# Patient Record
Sex: Female | Born: 1985 | Race: White | Hispanic: No | Marital: Single
Health system: Southern US, Community
[De-identification: ages and names within clinical notes are randomized; demographics above are authoritative.]

## PROBLEM LIST (undated history)

## (undated) ENCOUNTER — Inpatient Hospital Stay: Admission: EM | Payer: Self-pay | Source: Home / Self Care

---

## 2013-03-22 ENCOUNTER — Ambulatory Visit (HOSPITAL_COMMUNITY)
Admission: RE | Admit: 2013-03-22 | Discharge: 2013-03-22 | Disposition: A | Discharge status: Home / Self Care | Payer: Medicare Other | Source: Ambulatory Visit | Attending: Internal Medicine | Admitting: Internal Medicine

## 2013-03-22 DIAGNOSIS — R55 Syncope and collapse: Secondary | ICD-10-CM

## 2013-03-22 DIAGNOSIS — R569 Unspecified convulsions: Secondary | ICD-10-CM

## 2013-03-23 DIAGNOSIS — R569 Unspecified convulsions: Secondary | ICD-10-CM | POA: Insufficient documentation

## 2013-03-23 DIAGNOSIS — R55 Syncope and collapse: Secondary | ICD-10-CM | POA: Insufficient documentation

## 2013-03-23 NOTE — Procedures (Addendum)
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  Background: There is a well defined posterior dominant rhythm of 9 Hz that attenuates with eye opening. There is an increase in delta associated with drowsiness and normal sleep structures including vertex waves and k-complexes are seen. There is hypersynchrony during hyperventilation. Positive occipital sharp transients of sleep were seen.   Photic stimulation: Physiologic driving is not performed.   EEG Abnormalities: None  Clinical Interpretation: This normal EEG is recorded in the waking and sleep state. There was no seizure or seizure predisposition recorded on this study. Note that a normal EEG does not exclude the possibility of epilepsy.   Ritta Slot, MD [facility] [phone]  If 7pm- 7am, please page neurology on call at [phone].

## 2013-05-30 ENCOUNTER — Other Ambulatory Visit: Payer: Self-pay

## 2013-06-22 ENCOUNTER — Other Ambulatory Visit (HOSPITAL_COMMUNITY): Payer: Self-pay | Admitting: Obstetrics and Gynecology

## 2013-06-22 DIAGNOSIS — Z3682 Encounter for antenatal screening for nuchal translucency: Secondary | ICD-10-CM

## 2013-06-29 ENCOUNTER — Ambulatory Visit (HOSPITAL_COMMUNITY)
Admission: RE | Admit: 2013-06-29 | Discharge: 2013-06-29 | Disposition: A | Discharge status: Home / Self Care | Payer: Medicare Other | Source: Ambulatory Visit | Attending: Obstetrics and Gynecology | Admitting: Obstetrics and Gynecology

## 2013-06-29 ENCOUNTER — Encounter (HOSPITAL_COMMUNITY): Payer: Self-pay

## 2013-06-29 ENCOUNTER — Other Ambulatory Visit: Payer: Self-pay

## 2013-06-29 VITALS — BP 124/81 | HR 98 | Wt 165.0 lb

## 2013-06-29 DIAGNOSIS — O99340 Other mental disorders complicating pregnancy, unspecified trimester: Secondary | ICD-10-CM | POA: Insufficient documentation

## 2013-06-29 DIAGNOSIS — O99350 Diseases of the nervous system complicating pregnancy, unspecified trimester: Secondary | ICD-10-CM

## 2013-06-29 DIAGNOSIS — O3510X0 Maternal care for (suspected) chromosomal abnormality in fetus, unspecified, not applicable or unspecified: Secondary | ICD-10-CM | POA: Insufficient documentation

## 2013-06-29 DIAGNOSIS — Z3682 Encounter for antenatal screening for nuchal translucency: Secondary | ICD-10-CM

## 2013-06-29 DIAGNOSIS — Z8673 Personal history of transient ischemic attack (TIA), and cerebral infarction without residual deficits: Secondary | ICD-10-CM | POA: Insufficient documentation

## 2013-06-29 DIAGNOSIS — O351XX0 Maternal care for (suspected) chromosomal abnormality in fetus, not applicable or unspecified: Secondary | ICD-10-CM | POA: Insufficient documentation

## 2013-06-29 DIAGNOSIS — F1910 Other psychoactive substance abuse, uncomplicated: Secondary | ICD-10-CM | POA: Insufficient documentation

## 2013-06-29 DIAGNOSIS — Z3689 Encounter for other specified antenatal screening: Secondary | ICD-10-CM | POA: Insufficient documentation

## 2013-06-29 DIAGNOSIS — G40909 Epilepsy, unspecified, not intractable, without status epilepticus: Secondary | ICD-10-CM

## 2013-06-29 DIAGNOSIS — O09899 Supervision of other high risk pregnancies, unspecified trimester: Secondary | ICD-10-CM | POA: Insufficient documentation

## 2013-06-29 DIAGNOSIS — O352XX0 Maternal care for (suspected) hereditary disease in fetus, not applicable or unspecified: Secondary | ICD-10-CM | POA: Insufficient documentation

## 2013-06-29 NOTE — Consult Note (Signed)
Maternal Fetal Medicine Consultation  Requesting Provider(s): Hassell Doneraig Gaccione, MD  Reason for consultation: Cheryl Molina is a 28 yo G2P0010, EDD 01/02/2014 who is currently at 13 2/7 weeks referred due to seizure disorder.  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  In 2007, she developed a seizure disorder that is felt to be secondary to hydrocephalus and history of an encephalocele.   She reports that her baseline is 5-10 seizures / month despite multiple AEDs that she is currently taking.  The patient reports that [place] and recently established care with Dr. Adella HareApplegate ([place].  She is without complaints today.  Cheryl Molina reports a history of a prior 20 week loss - she reports that she was at home and the baby came out - a heart beat was appreciated after delivery.  There was some question that this might have been related to seizure activity that was poorly controlled at that time.  There was never any mention of possible cervical insufficiency.   HPI: OB History: OB History   Grav Para Term Preterm Abortions TAB SAB Ect Mult Living   2    1  1          PMH: As above  PSH: Cholecystectomy, VP shunt placement and redo  Meds:  Kepra 3000 mg daily Lamictal 100 mg BID Gabapentin 100 mg TID Dilantin 100 mg BID Topamax 50 mg BID PNV Zofran  Allergies: Vancomycin - "Redman syndrome"  FH: Father - diabetes, stroke, MI        Paternal grandfather - diabetes        Paternal grandmother - MI        Maternal grandmother - Parkinson's Disease        With the exception of a personal history of encephalocele, denies any other family history of birth defects or        hereditary disorders        Soc:  Denies tobacco or ETOH use   Review of Systems: no vaginal  bleeding or cramping/contractions, no LOF, no nausea/vomiting. All other systems reviewed and are negative.  PE:   Filed Vitals:   06/29/13 1313  BP: 124/81  Pulse: 98    GEN: well-appearing female ABD: gravid, NT  Ultrasound - see separate report.  First trimester screen performed as described in report.  A/P: 1) Single IUP at 13 2/7 weeks         2) Seizure disorder on multiple AEDs - Cheryl Molina continues to have frequent seizures despite multiple medications.  She will continue to need close follow up with her neurologist.  Women with seizure disorders are at increased risk of congenital abnormality regardless of treatment and would therefore recommend a detailed ultrasound at 18-20 weeks and fetal echo at 22 weeks if the fetal heart is not well visualized.  The maternal risk of seizures, however, far outweighs any potential risk for congenital anomalies due to AEDs. Close monitoring of drug levels is advised as the increased volume of distribution, hepatic metabolism and glomerular filtration rate increase during pregnancy and will often lead to inadequate plasma drug levels.  We have also requested records from her Neurologist in [place] for further information.  3) History of 20 week loss - the patient reports that this was likely related to her seizures being poorly controlled, but by history, would be concerned about the possibility of cervical insufficiency.  Recommendations: 1) Continue AED medications as written.  Doses may need to be adjusted to achieve therapeutic plasma levels (under the guidance of the patient's neurologist) 2) Recommend cervical length surveillance from 16-24 weeks. The patient is tentatively scheduled to follow up here at 16 weeks and will need reevaluation at least every other week until 24 weeks. 3) Detailed ultrasound at 18 weeks +/- fetal echo at 22 weeks 4) Serial growth ultrasounds (monthly) beginning at 28 weeks   Thank you for the opportunity to  be a part of the care of KELISHA DALL. Please contact our office if we can be of further assistance.   I spent approximately 30 minutes with this patient with over 50% of time spent in face-to-face counseling.  Alpha Gula, MD Maternal Fetal Medicine

## 2013-06-29 NOTE — Addendum Note (Signed)
Encounter addended by: Alessandra BevelsJennifer M. Chase PicketLineberry, RN on: 06/29/2013  1:12 PM<BR>     Documentation filed: Charges VN

## 2013-07-20 ENCOUNTER — Ambulatory Visit (HOSPITAL_COMMUNITY)
Admission: RE | Admit: 2013-07-20 | Discharge: 2013-07-20 | Disposition: A | Discharge status: Home / Self Care | Payer: Medicare Other | Source: Ambulatory Visit | Attending: Neurology | Admitting: Neurology

## 2013-07-20 ENCOUNTER — Encounter (HOSPITAL_COMMUNITY): Payer: Self-pay

## 2013-07-20 DIAGNOSIS — O99350 Diseases of the nervous system complicating pregnancy, unspecified trimester: Secondary | ICD-10-CM

## 2013-07-20 DIAGNOSIS — O352XX0 Maternal care for (suspected) hereditary disease in fetus, not applicable or unspecified: Secondary | ICD-10-CM | POA: Insufficient documentation

## 2013-07-20 DIAGNOSIS — F1910 Other psychoactive substance abuse, uncomplicated: Secondary | ICD-10-CM | POA: Insufficient documentation

## 2013-07-20 DIAGNOSIS — G40909 Epilepsy, unspecified, not intractable, without status epilepticus: Secondary | ICD-10-CM | POA: Insufficient documentation

## 2013-07-20 DIAGNOSIS — O09899 Supervision of other high risk pregnancies, unspecified trimester: Secondary | ICD-10-CM | POA: Insufficient documentation

## 2013-07-20 DIAGNOSIS — O09299 Supervision of pregnancy with other poor reproductive or obstetric history, unspecified trimester: Secondary | ICD-10-CM | POA: Insufficient documentation

## 2013-07-20 DIAGNOSIS — O99340 Other mental disorders complicating pregnancy, unspecified trimester: Secondary | ICD-10-CM | POA: Insufficient documentation

## 2013-07-29 ENCOUNTER — Other Ambulatory Visit (HOSPITAL_COMMUNITY): Payer: Self-pay | Admitting: Maternal and Fetal Medicine

## 2013-07-29 DIAGNOSIS — O99350 Diseases of the nervous system complicating pregnancy, unspecified trimester: Principal | ICD-10-CM

## 2013-07-29 DIAGNOSIS — G40909 Epilepsy, unspecified, not intractable, without status epilepticus: Secondary | ICD-10-CM

## 2013-08-03 ENCOUNTER — Ambulatory Visit (HOSPITAL_COMMUNITY)
Admission: RE | Admit: 2013-08-03 | Discharge: 2013-08-03 | Disposition: A | Discharge status: Home / Self Care | Payer: Medicare Other | Source: Ambulatory Visit | Attending: Obstetrics and Gynecology | Admitting: Obstetrics and Gynecology

## 2013-08-03 ENCOUNTER — Other Ambulatory Visit (HOSPITAL_COMMUNITY): Payer: Self-pay | Admitting: Maternal and Fetal Medicine

## 2013-08-03 DIAGNOSIS — O09899 Supervision of other high risk pregnancies, unspecified trimester: Secondary | ICD-10-CM | POA: Insufficient documentation

## 2013-08-03 DIAGNOSIS — O4410 Placenta previa with hemorrhage, unspecified trimester: Secondary | ICD-10-CM | POA: Insufficient documentation

## 2013-08-03 DIAGNOSIS — O99350 Diseases of the nervous system complicating pregnancy, unspecified trimester: Principal | ICD-10-CM

## 2013-08-03 DIAGNOSIS — O352XX0 Maternal care for (suspected) hereditary disease in fetus, not applicable or unspecified: Secondary | ICD-10-CM | POA: Insufficient documentation

## 2013-08-03 DIAGNOSIS — F1910 Other psychoactive substance abuse, uncomplicated: Secondary | ICD-10-CM | POA: Insufficient documentation

## 2013-08-03 DIAGNOSIS — O99340 Other mental disorders complicating pregnancy, unspecified trimester: Secondary | ICD-10-CM | POA: Insufficient documentation

## 2013-08-03 DIAGNOSIS — Z3689 Encounter for other specified antenatal screening: Secondary | ICD-10-CM

## 2013-08-03 DIAGNOSIS — Z363 Encounter for antenatal screening for malformations: Secondary | ICD-10-CM | POA: Insufficient documentation

## 2013-08-03 DIAGNOSIS — Z1389 Encounter for screening for other disorder: Secondary | ICD-10-CM | POA: Insufficient documentation

## 2013-08-03 DIAGNOSIS — O4412 Placenta previa with hemorrhage, second trimester: Secondary | ICD-10-CM

## 2013-08-03 DIAGNOSIS — Z8673 Personal history of transient ischemic attack (TIA), and cerebral infarction without residual deficits: Secondary | ICD-10-CM | POA: Insufficient documentation

## 2013-08-03 DIAGNOSIS — G40909 Epilepsy, unspecified, not intractable, without status epilepticus: Secondary | ICD-10-CM | POA: Insufficient documentation

## 2013-08-03 DIAGNOSIS — O09299 Supervision of pregnancy with other poor reproductive or obstetric history, unspecified trimester: Secondary | ICD-10-CM | POA: Insufficient documentation

## 2013-08-03 DIAGNOSIS — O358XX0 Maternal care for other (suspected) fetal abnormality and damage, not applicable or unspecified: Secondary | ICD-10-CM | POA: Insufficient documentation

## 2013-08-17 ENCOUNTER — Ambulatory Visit (HOSPITAL_COMMUNITY): Payer: Medicare Other

## 2013-08-25 ENCOUNTER — Ambulatory Visit (HOSPITAL_COMMUNITY)
Admission: RE | Admit: 2013-08-25 | Discharge: 2013-08-25 | Disposition: A | Discharge status: Home / Self Care | Payer: Medicare Other | Source: Ambulatory Visit | Attending: Obstetrics and Gynecology | Admitting: Obstetrics and Gynecology

## 2013-08-25 ENCOUNTER — Other Ambulatory Visit (HOSPITAL_COMMUNITY): Payer: Self-pay | Admitting: Maternal and Fetal Medicine

## 2013-08-25 DIAGNOSIS — G40909 Epilepsy, unspecified, not intractable, without status epilepticus: Secondary | ICD-10-CM

## 2013-08-25 DIAGNOSIS — Z3689 Encounter for other specified antenatal screening: Secondary | ICD-10-CM | POA: Insufficient documentation

## 2013-08-25 DIAGNOSIS — O99350 Diseases of the nervous system complicating pregnancy, unspecified trimester: Principal | ICD-10-CM

## 2013-08-31 ENCOUNTER — Ambulatory Visit (HOSPITAL_COMMUNITY): Payer: Medicare Other

## 2013-09-08 ENCOUNTER — Ambulatory Visit (HOSPITAL_COMMUNITY)
Admission: RE | Admit: 2013-09-08 | Discharge: 2013-09-08 | Disposition: A | Discharge status: Home / Self Care | Payer: Medicare Other | Source: Ambulatory Visit | Attending: Obstetrics and Gynecology | Admitting: Obstetrics and Gynecology

## 2013-09-08 DIAGNOSIS — O99350 Diseases of the nervous system complicating pregnancy, unspecified trimester: Principal | ICD-10-CM

## 2013-09-08 DIAGNOSIS — Z3689 Encounter for other specified antenatal screening: Secondary | ICD-10-CM | POA: Insufficient documentation

## 2013-09-08 DIAGNOSIS — G40909 Epilepsy, unspecified, not intractable, without status epilepticus: Secondary | ICD-10-CM | POA: Insufficient documentation

## 2013-09-14 ENCOUNTER — Ambulatory Visit (HOSPITAL_COMMUNITY): Payer: Medicare Other

## 2013-09-22 ENCOUNTER — Ambulatory Visit (HOSPITAL_COMMUNITY)
Admission: RE | Admit: 2013-09-22 | Discharge: 2013-09-22 | Disposition: A | Discharge status: Home / Self Care | Payer: Medicare Other | Source: Ambulatory Visit | Attending: Obstetrics and Gynecology | Admitting: Obstetrics and Gynecology

## 2013-09-22 ENCOUNTER — Encounter (HOSPITAL_COMMUNITY): Payer: Self-pay

## 2013-09-22 DIAGNOSIS — G40909 Epilepsy, unspecified, not intractable, without status epilepticus: Secondary | ICD-10-CM | POA: Insufficient documentation

## 2013-09-22 DIAGNOSIS — O99350 Diseases of the nervous system complicating pregnancy, unspecified trimester: Principal | ICD-10-CM

## 2013-09-22 DIAGNOSIS — Z3689 Encounter for other specified antenatal screening: Secondary | ICD-10-CM | POA: Insufficient documentation

## 2013-09-23 ENCOUNTER — Other Ambulatory Visit (HOSPITAL_COMMUNITY): Payer: Self-pay | Admitting: Obstetrics and Gynecology

## 2013-09-23 DIAGNOSIS — O4410 Placenta previa with hemorrhage, unspecified trimester: Secondary | ICD-10-CM

## 2013-09-23 DIAGNOSIS — IMO0002 Reserved for concepts with insufficient information to code with codable children: Secondary | ICD-10-CM

## 2013-09-23 DIAGNOSIS — O9989 Other specified diseases and conditions complicating pregnancy, childbirth and the puerperium: Secondary | ICD-10-CM

## 2013-09-23 DIAGNOSIS — O352XX0 Maternal care for (suspected) hereditary disease in fetus, not applicable or unspecified: Secondary | ICD-10-CM

## 2013-10-05 ENCOUNTER — Other Ambulatory Visit (HOSPITAL_COMMUNITY): Payer: Self-pay | Admitting: Obstetrics and Gynecology

## 2013-10-05 DIAGNOSIS — O352XX0 Maternal care for (suspected) hereditary disease in fetus, not applicable or unspecified: Secondary | ICD-10-CM

## 2013-10-05 DIAGNOSIS — O4410 Placenta previa with hemorrhage, unspecified trimester: Secondary | ICD-10-CM

## 2013-10-05 DIAGNOSIS — O9989 Other specified diseases and conditions complicating pregnancy, childbirth and the puerperium: Secondary | ICD-10-CM

## 2013-10-05 DIAGNOSIS — Z8673 Personal history of transient ischemic attack (TIA), and cerebral infarction without residual deficits: Secondary | ICD-10-CM

## 2013-10-05 DIAGNOSIS — IMO0002 Reserved for concepts with insufficient information to code with codable children: Secondary | ICD-10-CM

## 2013-10-05 DIAGNOSIS — O09299 Supervision of pregnancy with other poor reproductive or obstetric history, unspecified trimester: Secondary | ICD-10-CM

## 2013-10-07 ENCOUNTER — Ambulatory Visit (HOSPITAL_COMMUNITY): Payer: Medicare Other

## 2013-10-12 ENCOUNTER — Other Ambulatory Visit (HOSPITAL_COMMUNITY): Payer: Self-pay | Admitting: Obstetrics and Gynecology

## 2013-10-12 ENCOUNTER — Ambulatory Visit (HOSPITAL_COMMUNITY)
Admission: RE | Admit: 2013-10-12 | Discharge: 2013-10-12 | Disposition: A | Discharge status: Home / Self Care | Payer: Medicare Other | Source: Ambulatory Visit | Attending: Obstetrics and Gynecology | Admitting: Obstetrics and Gynecology

## 2013-10-12 DIAGNOSIS — Z8673 Personal history of transient ischemic attack (TIA), and cerebral infarction without residual deficits: Secondary | ICD-10-CM | POA: Diagnosis not present

## 2013-10-12 DIAGNOSIS — O352XX0 Maternal care for (suspected) hereditary disease in fetus, not applicable or unspecified: Secondary | ICD-10-CM | POA: Diagnosis not present

## 2013-10-12 DIAGNOSIS — O9989 Other specified diseases and conditions complicating pregnancy, childbirth and the puerperium: Secondary | ICD-10-CM

## 2013-10-12 DIAGNOSIS — Z3689 Encounter for other specified antenatal screening: Secondary | ICD-10-CM | POA: Diagnosis not present

## 2013-10-12 DIAGNOSIS — IMO0002 Reserved for concepts with insufficient information to code with codable children: Secondary | ICD-10-CM

## 2013-10-12 DIAGNOSIS — O4410 Placenta previa with hemorrhage, unspecified trimester: Secondary | ICD-10-CM | POA: Diagnosis present

## 2013-10-12 DIAGNOSIS — O09299 Supervision of pregnancy with other poor reproductive or obstetric history, unspecified trimester: Secondary | ICD-10-CM

## 2013-10-12 DIAGNOSIS — O99340 Other mental disorders complicating pregnancy, unspecified trimester: Secondary | ICD-10-CM | POA: Insufficient documentation

## 2013-10-19 ENCOUNTER — Ambulatory Visit (HOSPITAL_COMMUNITY): Payer: Medicare Other

## 2013-10-26 ENCOUNTER — Ambulatory Visit (HOSPITAL_COMMUNITY)
Admission: RE | Admit: 2013-10-26 | Discharge: 2013-10-26 | Disposition: A | Discharge status: Home / Self Care | Payer: Medicare Other | Source: Ambulatory Visit | Attending: Obstetrics and Gynecology | Admitting: Obstetrics and Gynecology

## 2013-10-26 ENCOUNTER — Other Ambulatory Visit (HOSPITAL_COMMUNITY): Payer: Self-pay | Admitting: Obstetrics and Gynecology

## 2013-10-26 DIAGNOSIS — O9989 Other specified diseases and conditions complicating pregnancy, childbirth and the puerperium: Secondary | ICD-10-CM

## 2013-10-26 DIAGNOSIS — O352XX0 Maternal care for (suspected) hereditary disease in fetus, not applicable or unspecified: Secondary | ICD-10-CM | POA: Diagnosis present

## 2013-10-26 DIAGNOSIS — O4410 Placenta previa with hemorrhage, unspecified trimester: Secondary | ICD-10-CM | POA: Diagnosis not present

## 2013-10-26 DIAGNOSIS — O99340 Other mental disorders complicating pregnancy, unspecified trimester: Secondary | ICD-10-CM | POA: Insufficient documentation

## 2013-10-26 DIAGNOSIS — IMO0002 Reserved for concepts with insufficient information to code with codable children: Secondary | ICD-10-CM

## 2013-10-28 ENCOUNTER — Other Ambulatory Visit (HOSPITAL_COMMUNITY): Payer: Self-pay | Admitting: Obstetrics and Gynecology

## 2013-10-28 DIAGNOSIS — O352XX1 Maternal care for (suspected) hereditary disease in fetus, fetus 1: Secondary | ICD-10-CM

## 2013-10-28 DIAGNOSIS — Z8673 Personal history of transient ischemic attack (TIA), and cerebral infarction without residual deficits: Secondary | ICD-10-CM

## 2013-10-28 DIAGNOSIS — O09299 Supervision of pregnancy with other poor reproductive or obstetric history, unspecified trimester: Secondary | ICD-10-CM

## 2013-10-28 DIAGNOSIS — O365921 Maternal care for other known or suspected poor fetal growth, second trimester, fetus 1: Secondary | ICD-10-CM

## 2013-10-28 DIAGNOSIS — O9989 Other specified diseases and conditions complicating pregnancy, childbirth and the puerperium: Secondary | ICD-10-CM

## 2013-10-28 DIAGNOSIS — IMO0002 Reserved for concepts with insufficient information to code with codable children: Secondary | ICD-10-CM

## 2013-11-02 ENCOUNTER — Ambulatory Visit (HOSPITAL_COMMUNITY): Payer: Medicare Other | Attending: Obstetrics and Gynecology

## 2014-02-21 ENCOUNTER — Encounter (HOSPITAL_COMMUNITY): Payer: Self-pay

## 2014-05-04 ENCOUNTER — Encounter (HOSPITAL_COMMUNITY): Payer: Self-pay | Admitting: *Deleted

## 2016-01-02 IMAGING — US US MFM FETAL NUCHAL TRANSLUCENCY
1 series · 13 of 28 positions shown · non-contrast
Comparison: none

[Series 1: us mfm fetal nuchal translucency · 0.18mm/px · 36 acquisitions, 13 frames shown]
[im 2/36]
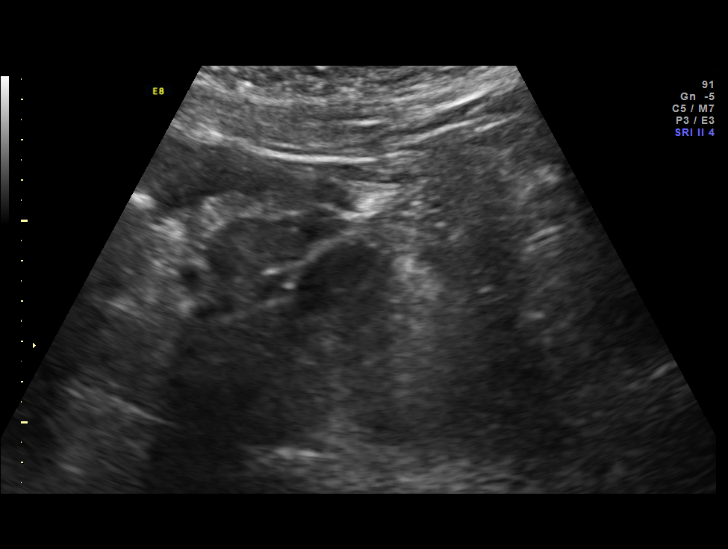
[im 4/36]
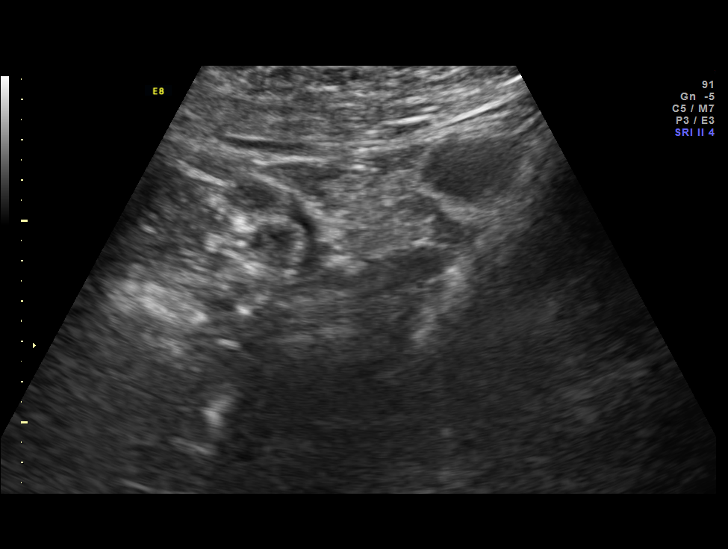
[im 7/36]
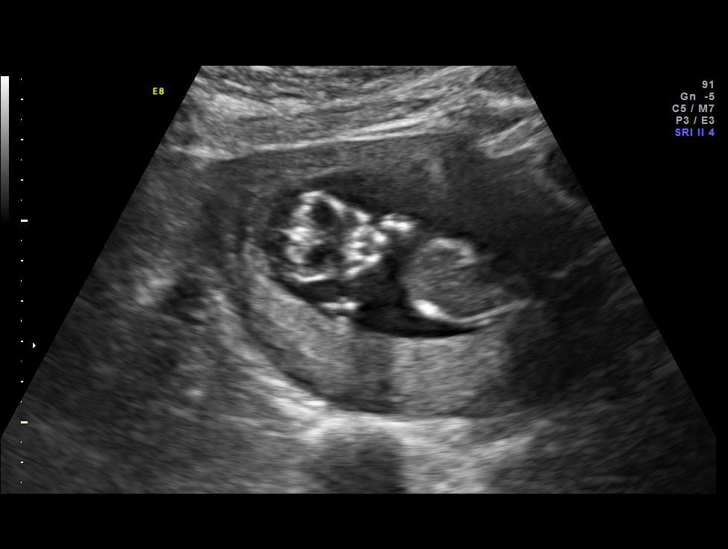
[im 10/36]
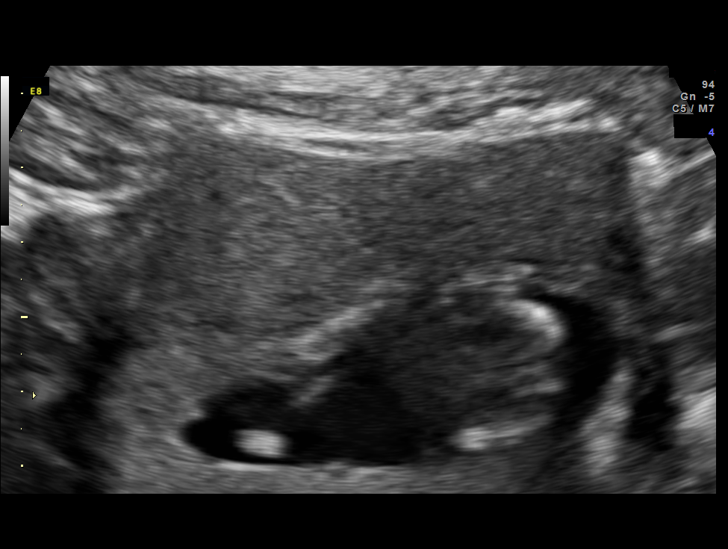
[im 12/36]
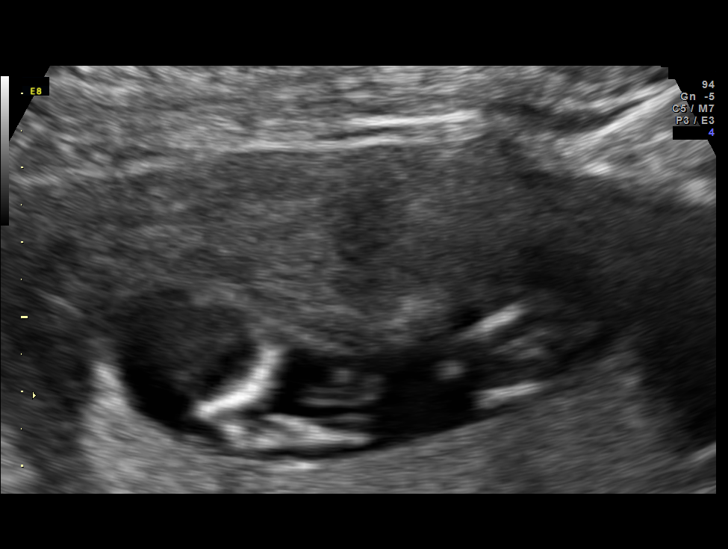
[im 15/36]
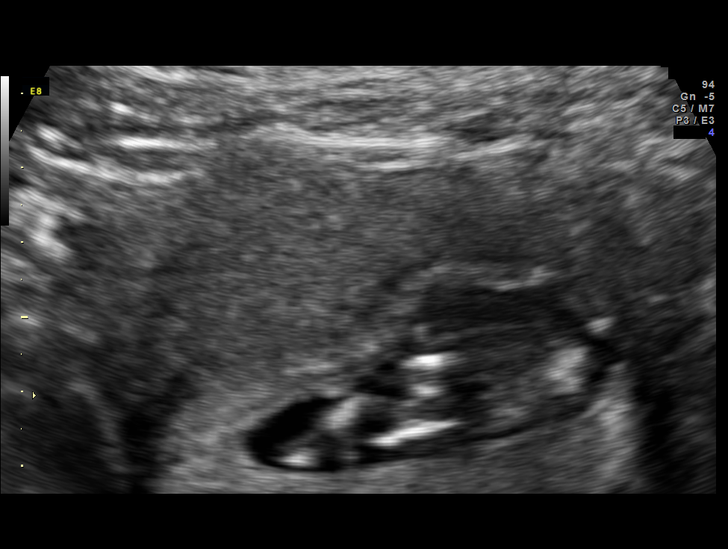
[im 19/36]
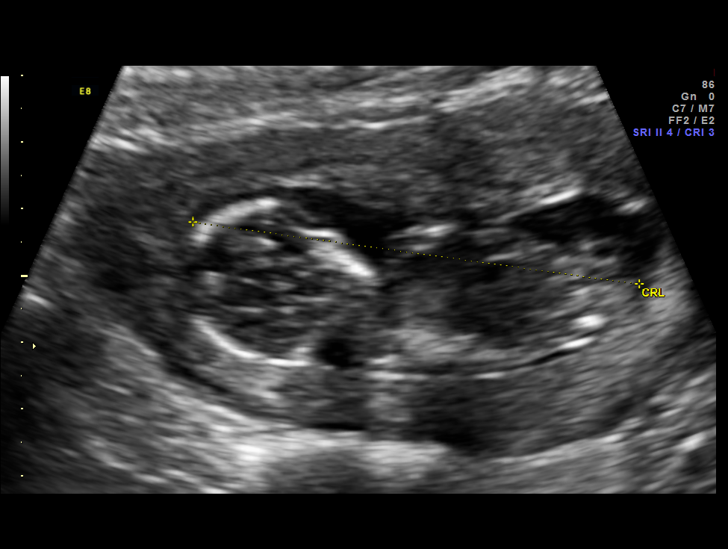
[im 21/36]
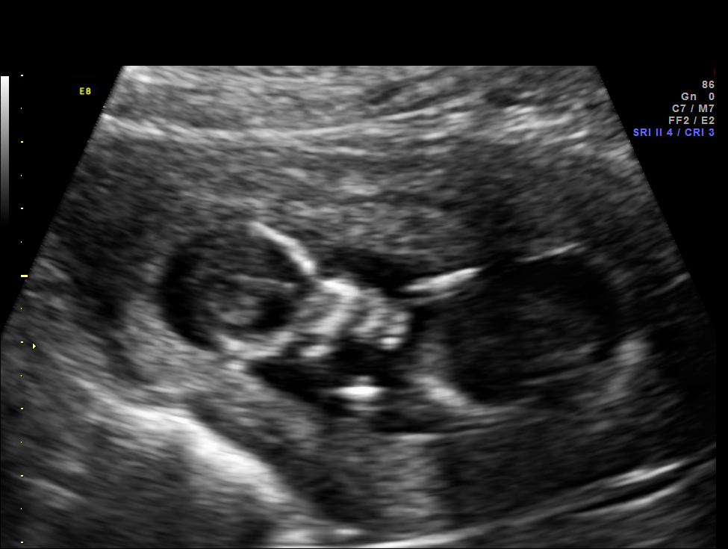
[im 24/36]
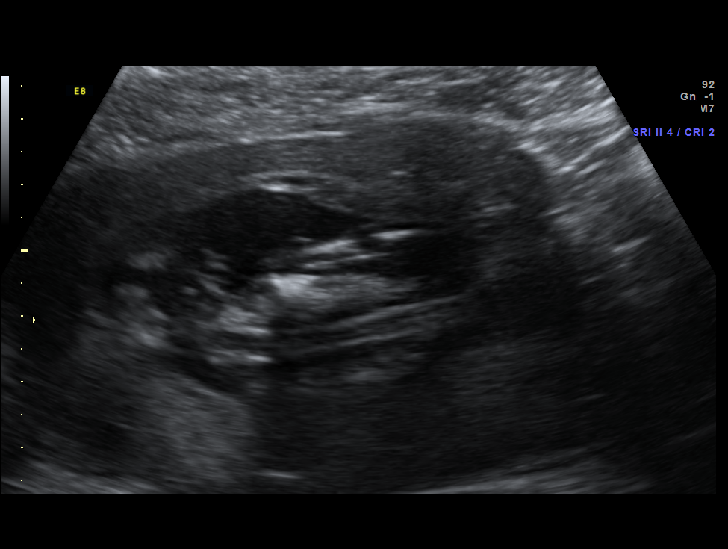
[im 26/36]
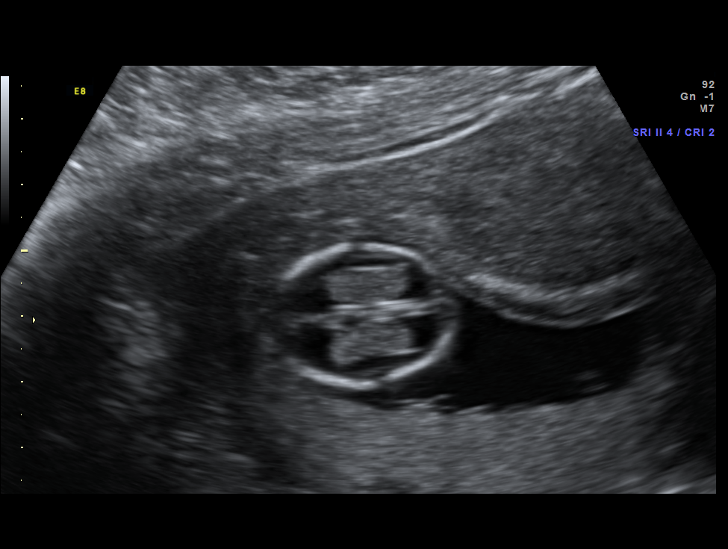
[im 29/36]
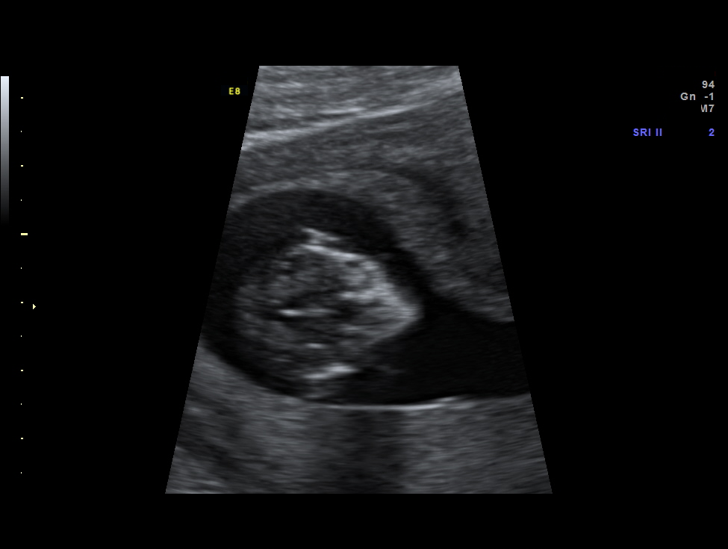
[im 32/36]
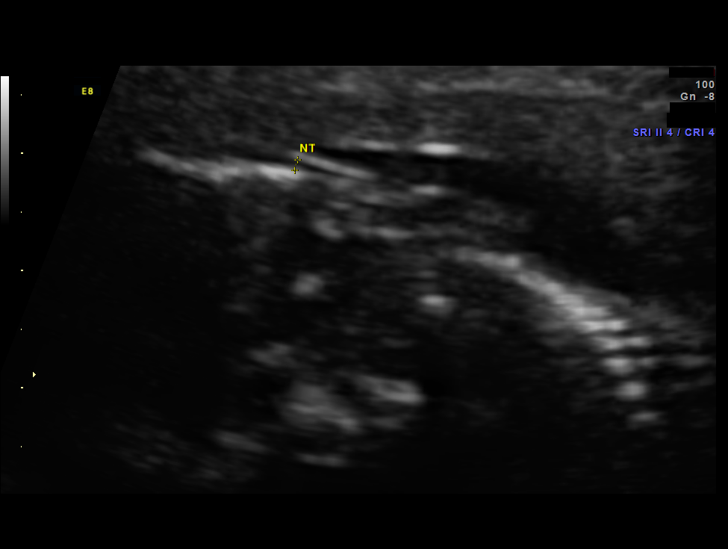
[im 34/36]
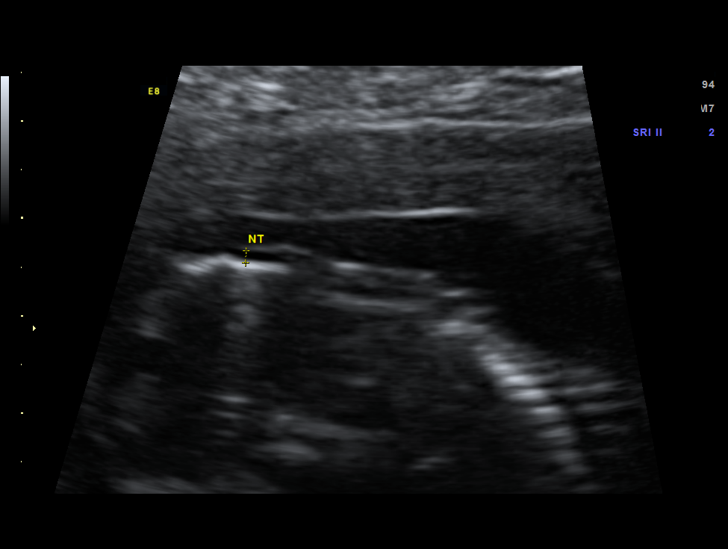

[13 of 28 positions shown; findings below may reference images not displayed]

OBSTETRICS REPORT
                      (Signed Final 06/29/2013 [DATE])

Service(s) Provided

Indications

 First trimester aneuploidy screen (NT)
 High risk pregnancy due to maternal drug abuse        648.40, 305.90,

 Seizure disorder
 History of Transient ischemic attack (7610)
 History of genetic / anatomic abnormality -
 personal: ONTD/hydrocephalus
Fetal Evaluation

 Num Of Fetuses:    1
 Fetal Heart Rate:  152                          bpm
 Cardiac Activity:  Observed
 Presentation:      Breech
 Placenta:          Posterior, above cervical
                    os

 Amniotic Fluid
 AFI FV:      Subjectively within normal limits
Gestational Age

 Best:          13w 2d     Det. By:  Early Ultrasound         EDD:   01/02/14
                                     (05/27/13)
1st Trimester Genetic Sonogram Screening

 CRL:            67.4  mm    G. Age:   12w 6d                 EDD:   01/05/14
 Nuc Trans:       1.4  mm
Cervix Uterus Adnexa

 Cervix:       Normal appearance by transabdominal scan. Appears
               closed, without funnelling.
 Left Ovary:    Not visualized. No adnexal mass visualized.
 Right Ovary:   Within normal limits.
Impression

 IUP at 13+2 weeks
 No gross abnormalities identified
 NT measurement was within normal limits for this GA; NB
 could not be evaluated due to fetal position
 Normal amniotic fluid volume
 Measurements consistent with prior US
Recommendations

 Follow-up ultrasound in 5-6 weeks for detailed anatomic
 survey (MFM side)

 questions or concerns.
